# Patient Record
Sex: Male | Born: 1985 | Race: White | Hispanic: No | Marital: Single | Smoking: Current every day smoker
Health system: Southern US, Community
[De-identification: ages and names within clinical notes are randomized; demographics above are authoritative.]

## PROBLEM LIST (undated history)

## (undated) HISTORY — PX: DENTAL SURGERY: SHX609

## (undated) HISTORY — PX: KNEE SURGERY: SHX244

---

## 2017-12-28 ENCOUNTER — Encounter (HOSPITAL_COMMUNITY): Payer: Self-pay | Admitting: Emergency Medicine

## 2017-12-28 ENCOUNTER — Emergency Department (HOSPITAL_COMMUNITY)
Admission: EM | Admit: 2017-12-28 | Discharge: 2017-12-28 | Disposition: A | Discharge status: Home / Self Care | Payer: Self-pay | Attending: Emergency Medicine | Admitting: Emergency Medicine

## 2017-12-28 ENCOUNTER — Other Ambulatory Visit: Payer: Self-pay

## 2017-12-28 DIAGNOSIS — R21 Rash and other nonspecific skin eruption: Secondary | ICD-10-CM | POA: Insufficient documentation

## 2017-12-28 MED ORDER — PERMETHRIN 5 % EX CREA: TOPICAL_CREAM | CUTANEOUS | 1 refills | Status: AC

## 2017-12-28 NOTE — Discharge Instructions (Signed)
Today you were seen for a rash.  Per your request you have been given prescription for scabies treatment.  Please make sure that you adequately clean your living space including washing all clothing and bedding in hot water or placing in an airtight bag for 72 hours.  Please make sure that you get an exterminator to evaluate your living quarters for evidence of bedbugs, fleas, or other biting insects. ° °If your symptoms do not improve please seek additional medical care and evaluation. °

## 2017-12-28 NOTE — ED Triage Notes (Signed)
Patient c/o itching rash to bilateral arms and legs. Reports being around others with scabies. Hx of same.

## 2017-12-28 NOTE — ED Notes (Signed)
PT DISCHARGED. INSTRUCTIONS AND PRESCRIPTION GIVEN. AAOX4. PT IN NO APPARENT DISTRESS OR PAIN. THE OPPORTUNITY TO ASK QUESTIONS WAS PROVIDED. 

## 2017-12-28 NOTE — ED Provider Notes (Signed)
[facility]-EMERGENCY DEPT Provider Note   CSN: [id number] Arrival date & time: 12/28/17  1701     History   Chief Complaint Chief Complaint  Patient presents with  . Rash    HPI Bernard Sanders is a [age] y.o. male who presents today for evaluation of itchy rash to bilateral arms and legs.  He reports that he has had the symptoms for approximately 2 weeks and they have been gradually worsening.  He has a close household contact who has a similar rash.  He reports that he has had this rash in the past and was diagnosed with scabies.  He denies any fevers, muscle aches body aches nausea vomiting or other concerns or complaints.  HPI  History reviewed. No pertinent past medical history.  There are no active problems to display for this patient.   History reviewed. No pertinent surgical history.      Home Medications    Prior to Admission medications   Medication Sig Start Date End Date Taking? Authorizing Provider  permethrin (ELIMITE) 5 % cream Please apply a thin layer to your entire body, and wash off after 12 hours.  Please repeat treatment in 1 week 12/28/17   Cristina GongHammond, Elizabeth W, PA-C    Family History No family history on file.  Social History Social History   Tobacco Use  . Smoking status: Not on file  Substance Use Topics  . Alcohol use: Not on file  . Drug use: Not on file     Allergies   Patient has no known allergies.   Review of Systems Review of Systems  Constitutional: Negative for chills and fever.  Musculoskeletal: Negative for neck pain.  Skin: Positive for rash and wound. Negative for color change and pallor.  All other systems reviewed and are negative.    Physical Exam Updated Vital Signs BP (!) 148/98 (BP Location: Right Arm)   Pulse 94   Temp 98.1 F (36.7 C) (Oral)   Resp 18   SpO2 99%   Physical Exam  Constitutional: He is oriented to person, place, and time. He appears well-developed. No distress.    HENT:  Head: Normocephalic.  Eyes: Conjunctivae are normal. No scleral icterus.  Neck: Normal range of motion. Neck supple.  Neurological: He is alert and oriented to person, place, and time.  Skin: He is not diaphoretic.  Diffuse red palpable bumps, primarily on bilateral legs, arms, torso.  There is occasional areas of secondary excoriation.  No abnormal erythema, induration, or drainage.  There are few burrows in between fingers bilaterally.  Psychiatric: He has a normal mood and affect. His behavior is normal.  Nursing note and vitals reviewed.    ED Treatments / Results  Labs (all labs ordered are listed, but only abnormal results are displayed) Labs Reviewed - No data to display  EKG None  Radiology No results found.  Procedures Procedures (including critical care time)  Medications Ordered in ED Medications - No data to display   Initial Impression / Assessment and Plan / ED Course  I have reviewed the triage vital signs and the nursing notes.  Pertinent labs & imaging results that were available during my care of the patient were reviewed by me and considered in my medical decision making (see chart for details).    Discussed diagnosis & treatment of scabies.  They have been advised to followup with primary care doctor 2 weeks after treatment.  They have also been advised to clean entire  household including washing sheets and using R.I.D. spray in the car and on sofa.   The use of permethrin cream was discussed as well, they were told to use cream from head to toe & leave on  for 8-12 hours.  They've been advised to repeat treatment if new eruptions occur.   Return precautions were discussed with patient who states their understanding.  At the time of discharge patient denied any unaddressed complaints or concerns.  Patient is agreeable for discharge home.   Final Clinical Impressions(s) / ED Diagnoses   Final diagnoses:  Rash    ED Discharge Orders         Ordered    permethrin (ELIMITE) 5 % cream     12/28/17 1742       Cristina Gong, [place] 12/28/17 1746    Shaune Pollack, MD 12/30/17 [phone]

## 2018-03-05 ENCOUNTER — Encounter (HOSPITAL_COMMUNITY): Payer: Self-pay

## 2018-03-05 ENCOUNTER — Emergency Department (HOSPITAL_COMMUNITY)
Admission: EM | Admit: 2018-03-05 | Discharge: 2018-03-05 | Disposition: A | Discharge status: Home / Self Care | Payer: Self-pay | Attending: Emergency Medicine | Admitting: Emergency Medicine

## 2018-03-05 ENCOUNTER — Other Ambulatory Visit: Payer: Self-pay

## 2018-03-05 DIAGNOSIS — T401X1A Poisoning by heroin, accidental (unintentional), initial encounter: Secondary | ICD-10-CM | POA: Insufficient documentation

## 2018-03-05 DIAGNOSIS — F17210 Nicotine dependence, cigarettes, uncomplicated: Secondary | ICD-10-CM | POA: Insufficient documentation

## 2018-03-05 MED ORDER — SODIUM CHLORIDE 0.9 % IV BOLUS
1000.0000 mL | Freq: Once | INTRAVENOUS | Status: AC
  Administered 2018-03-05: 1000 mL via INTRAVENOUS

## 2018-03-05 MED ORDER — ONDANSETRON HCL 4 MG/2ML IJ SOLN
4.0000 mg | Freq: Once | INTRAMUSCULAR | Status: AC
  Administered 2018-03-05: 4 mg via INTRAVENOUS
  Filled 2018-03-05: qty 2
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
## 2018-03-05 NOTE — ED Provider Notes (Signed)
[facility] [place] Bernard Sanders Provider Note  CSN: [id number] Arrival date & time: 03/05/18 0037  Chief Complaint(s) Heroin Overdose  HPI Bernard Sanders is a 32 y.o. 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  Upon arrival patient was already alert and oriented x4 after receiving Narcan at home.  States that he relapsed today and shot up with heroin.  Patient was at home with his girlfriend who found him unconscious and apneic.  She had Narcan at hand and administered it.  She called EMS.  Upon EMS arrival patient was still lethargic but did not require additional Narcan.  He remained hemodynamically stable in route.  Patient now currently endorses mild nausea but denies any other physical complaints including chest pain, shortness of breath, abdominal pain, headache, neck pain.  HPI  Past Medical History History reviewed. No pertinent past medical history. There are no active problems to display for this patient.  Home Medication(s) Prior to Admission medications   Medication Sig Start Date End Date Taking? Authorizing Provider  permethrin (ELIMITE) 5 % cream Please apply a thin layer to your entire body, and wash off after 12 hours.  Please repeat treatment in 1 week 12/28/17   Norman Clay                                                                                                                                    Past Surgical History History reviewed. No pertinent surgical history. Family History No family history on file.  Social History Social History   Tobacco Use  . Smoking status: Current Every Day Smoker    Packs/day: 0.50    Types: Cigarettes  . Smokeless tobacco: Never Used  Substance Use Topics  . Alcohol use: Yes  . Drug use: Yes    Comment: Occasional use   Allergies Patient has no known allergies.  Review of  Systems Review of Systems All other systems are reviewed and are negative for acute change except as noted in the HPI  Physical Exam Vital Signs  I have reviewed the triage vital signs BP 119/79 (BP Location: Right Arm)   Pulse 83   Temp 98.2 F (36.8 C) (Oral)   Resp 16   Ht 6\' 1"  (1.854 m)   Wt 103 kg   SpO2 90%   BMI 29.95 kg/m   Physical Exam  Constitutional: He is oriented to person, place, and time. He appears well-developed and well-nourished. No distress.  HENT:  Head: Normocephalic and atraumatic.  Nose: Nose normal.  Eyes: Pupils are equal, round, and reactive to light. Conjunctivae and EOM are normal. Right eye exhibits no discharge. Left eye exhibits no discharge. No scleral icterus.  Neck: Normal range of motion. Neck supple.  Cardiovascular: Normal rate and regular rhythm. Exam reveals no gallop and no friction  rub.  No murmur heard. Pulmonary/Chest: Effort normal and breath sounds normal. No stridor. No respiratory distress. He has no rales.  Abdominal: Soft. He exhibits no distension. There is no tenderness.  Musculoskeletal: He exhibits no edema or tenderness.  Neurological: He is alert and oriented to person, place, and time.  Skin: Skin is warm and dry. No rash noted. He is not diaphoretic. No erythema.  Numerous tattoos throughout his body.  Psychiatric: He has a normal mood and affect.  Vitals reviewed.   ED Results and Treatments Labs (all labs ordered are listed, but only abnormal results are displayed) Labs Reviewed - No data to display                                                                                                                       EKG  EKG Interpretation  Date/Time:  Thursday March 05 2018 00:37:44 EDT Ventricular Rate:  80 PR Interval:    QRS Duration: 123 QT Interval:  407 QTC Calculation: 470 R Axis:   40 Text Interpretation:  Sinus rhythm consider atypical RBBB NO STEMI No old tracing to compare Confirmed by  Drema Pry [phone]) on 03/05/2018 2:12:52 AM      Radiology No results found. Pertinent labs & imaging results that were available during my care of the patient were reviewed by me and considered in my medical decision making (see chart for details).  Medications Ordered in ED Medications  ondansetron (ZOFRAN) injection 4 mg (4 mg Intravenous Given 03/05/18 0112)  sodium chloride 0.9 % bolus 1,000 mL (0 mLs Intravenous Stopped 03/05/18 0218)                                                                                                                                    Procedures Procedures  (including critical care time)  Medical Decision Making / ED Course I have reviewed the nursing notes for this encounter and the patient's prior records (if available in EHR or on provided paperwork).    Patient presented with heroin overdose requiring IM Narcan.  Did not require additional administration by EMS.  Currently he is alert and oriented x4, hemodynamically stable.  Given Zofran for nausea and IV fluids.  Monitored on cardiac monitor and pulse ox.  Monitored for 3 hours total.  No change in status.  The patient appears reasonably screened and/or stabilized for discharge and I doubt any other medical  condition or other [facility] requiring further screening, evaluation, or treatment in the ED at this time prior to discharge.  The patient is safe for discharge with strict return precautions.   Final Clinical Impression(s) / ED Diagnoses Final diagnoses:  Accidental overdose of heroin, initial encounter [facility])   Disposition: Discharge  Condition: Good  I have discussed the results, Dx and Tx plan with the patient who expressed understanding and agree(s) with the plan. Discharge instructions discussed at great length. The patient was given strict return precautions who verbalized understanding of the instructions. No further questions at time of discharge.    ED Discharge Orders     None         This chart was dictated using voice recognition software.  Despite best efforts to proofread,  errors can occur which can change the documentation meaning.   Nira Conn, MD 03/05/18 [phone]

## 2018-03-05 NOTE — ED Notes (Signed)
Blood work drawn and sent to lab, awaiting orders

## 2018-03-05 NOTE — ED Notes (Signed)
Patient falling asleep-O2 sat 87%-O2 restarted 4l/[place]

## 2018-03-05 NOTE — ED Notes (Signed)
Bed: RESB Expected date:  Expected time:  Means of arrival:  Comments: EMS heroin overdose/Narcan 4 mg

## 2018-09-10 ENCOUNTER — Encounter (HOSPITAL_COMMUNITY): Payer: Self-pay

## 2018-09-10 ENCOUNTER — Emergency Department (HOSPITAL_COMMUNITY): Payer: Self-pay

## 2018-09-10 ENCOUNTER — Other Ambulatory Visit: Payer: Self-pay

## 2018-09-10 ENCOUNTER — Emergency Department (HOSPITAL_COMMUNITY)
Admission: EM | Admit: 2018-09-10 | Discharge: 2018-09-10 | Disposition: A | Discharge status: Home / Self Care | Payer: Self-pay | Attending: Emergency Medicine | Admitting: Emergency Medicine

## 2018-09-10 DIAGNOSIS — Z23 Encounter for immunization: Secondary | ICD-10-CM | POA: Insufficient documentation

## 2018-09-10 DIAGNOSIS — Y929 Unspecified place or not applicable: Secondary | ICD-10-CM | POA: Insufficient documentation

## 2018-09-10 DIAGNOSIS — S61401A Unspecified open wound of right hand, initial encounter: Secondary | ICD-10-CM | POA: Insufficient documentation

## 2018-09-10 DIAGNOSIS — F17210 Nicotine dependence, cigarettes, uncomplicated: Secondary | ICD-10-CM | POA: Insufficient documentation

## 2018-09-10 DIAGNOSIS — Y999 Unspecified external cause status: Secondary | ICD-10-CM | POA: Insufficient documentation

## 2018-09-10 DIAGNOSIS — W25XXXA Contact with sharp glass, initial encounter: Secondary | ICD-10-CM | POA: Insufficient documentation

## 2018-09-10 DIAGNOSIS — Y9389 Activity, other specified: Secondary | ICD-10-CM | POA: Insufficient documentation

## 2018-09-10 DIAGNOSIS — S62329A Displaced fracture of shaft of unspecified metacarpal bone, initial encounter for closed fracture: Secondary | ICD-10-CM | POA: Insufficient documentation

## 2018-09-10 MED ORDER — BACITRACIN ZINC 500 UNIT/GM EX OINT
TOPICAL_OINTMENT | Freq: Two times a day (BID) | CUTANEOUS | Status: DC
  Administered 2018-09-10: 1 via TOPICAL
  Filled 2018-09-10: qty 3.6

## 2018-09-10 MED ORDER — TETANUS-DIPHTH-ACELL PERTUSSIS 5-2.5-18.5 LF-MCG/0.5 IM SUSP
0.5000 mL | Freq: Once | INTRAMUSCULAR | Status: AC
  Administered 2018-09-10: 0.5 mL via INTRAMUSCULAR
  Filled 2018-09-10: qty 0.5

## 2018-09-10 MED ORDER — BACITRACIN ZINC 500 UNIT/GM EX OINT: 1.0000 "application " | TOPICAL_OINTMENT | Freq: Two times a day (BID) | CUTANEOUS | 0 refills | Status: AC

## 2018-09-10 NOTE — ED Notes (Signed)
Area cleansed applied bacitracin, and wrapped with roll gauzed.
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
## 2018-09-10 NOTE — Discharge Instructions (Signed)
Apply the bacitracin ointment 2 times per day.  If you notice signs of infection such as increasing pain, drainage (especially purulent drainage), redness or warmth to the wound, fever, or any other new/concerning symptoms then return to this or any other ER for evaluation.  You may take ibuprofen and/or Tylenol for pain.  There is a possible small fracture at the base of your fifth metacarpal.  This does not appear to be causing any specific structural damage but if you develop severe or increasing pain you should return to be evaluated.
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
## 2020-02-26 IMAGING — DX RIGHT HAND - COMPLETE 3+ VIEW
3 series · 3 of 3 positions shown · non-contrast
Comparison: None.

CLINICAL DATA: Pain after punching mirror

EXAM:
RIGHT HAND - COMPLETE 3+ VIEW

[hand ap]
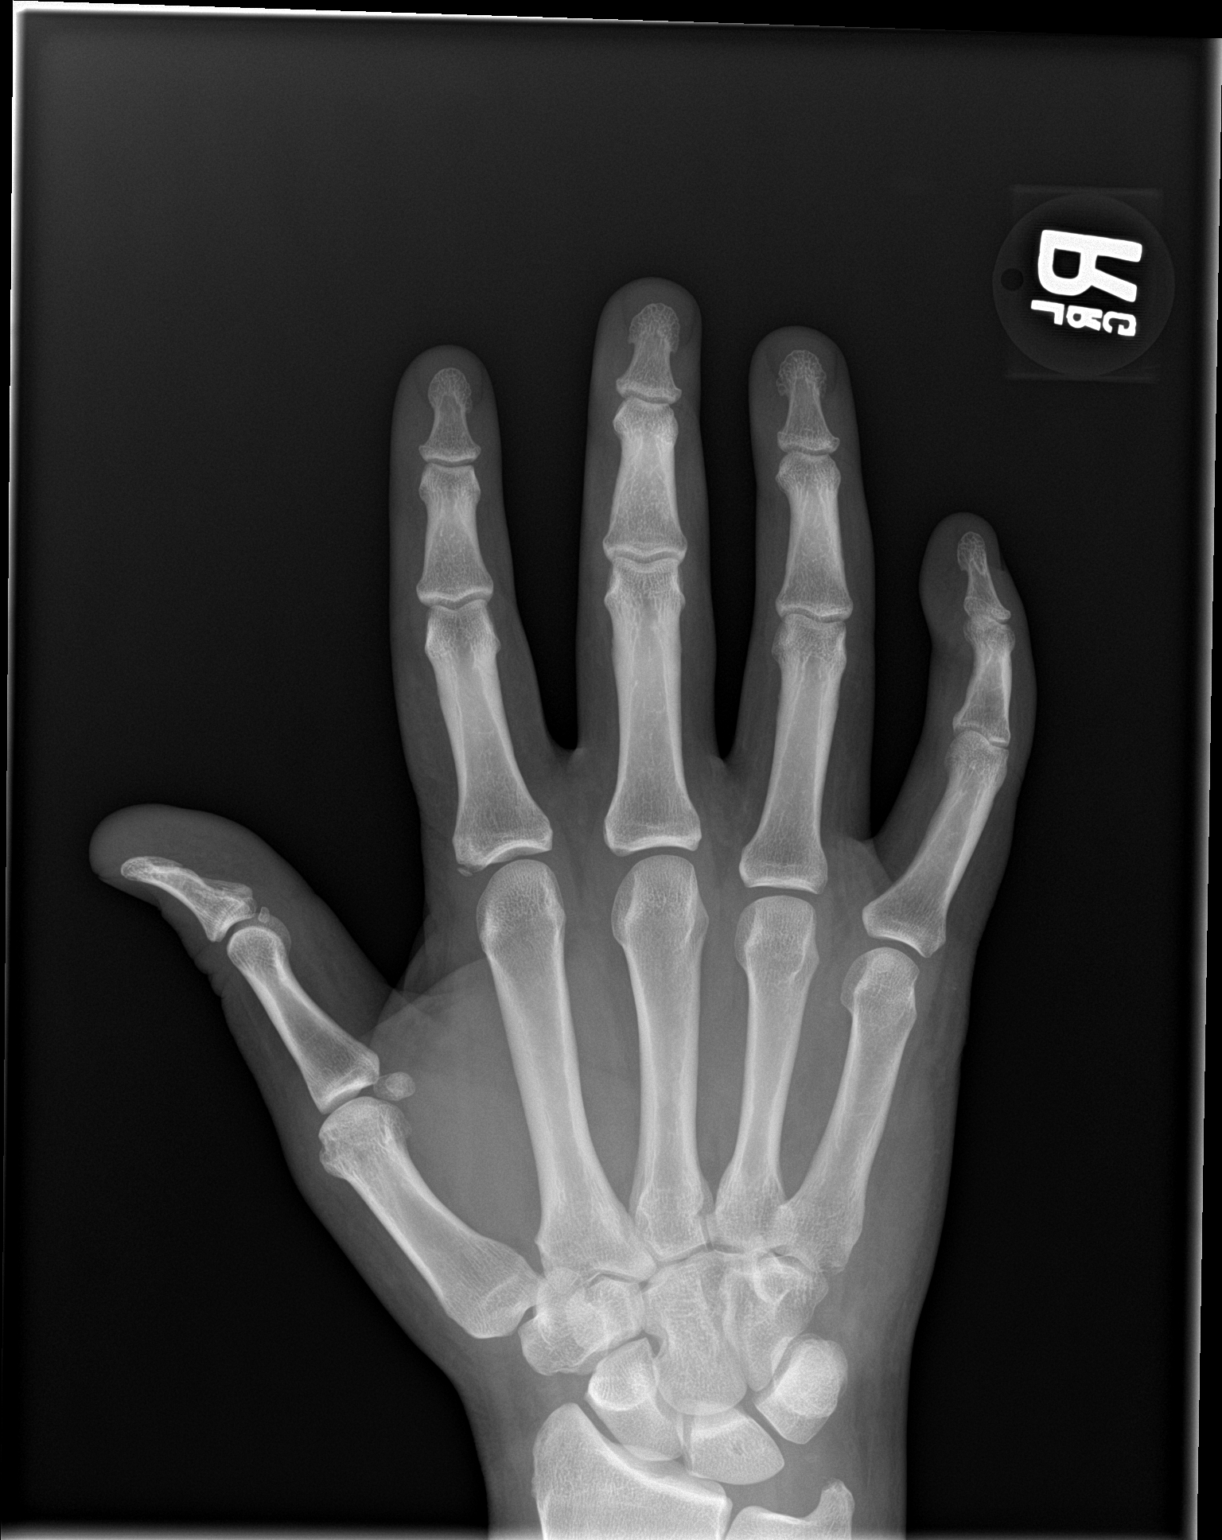

[hand obl]
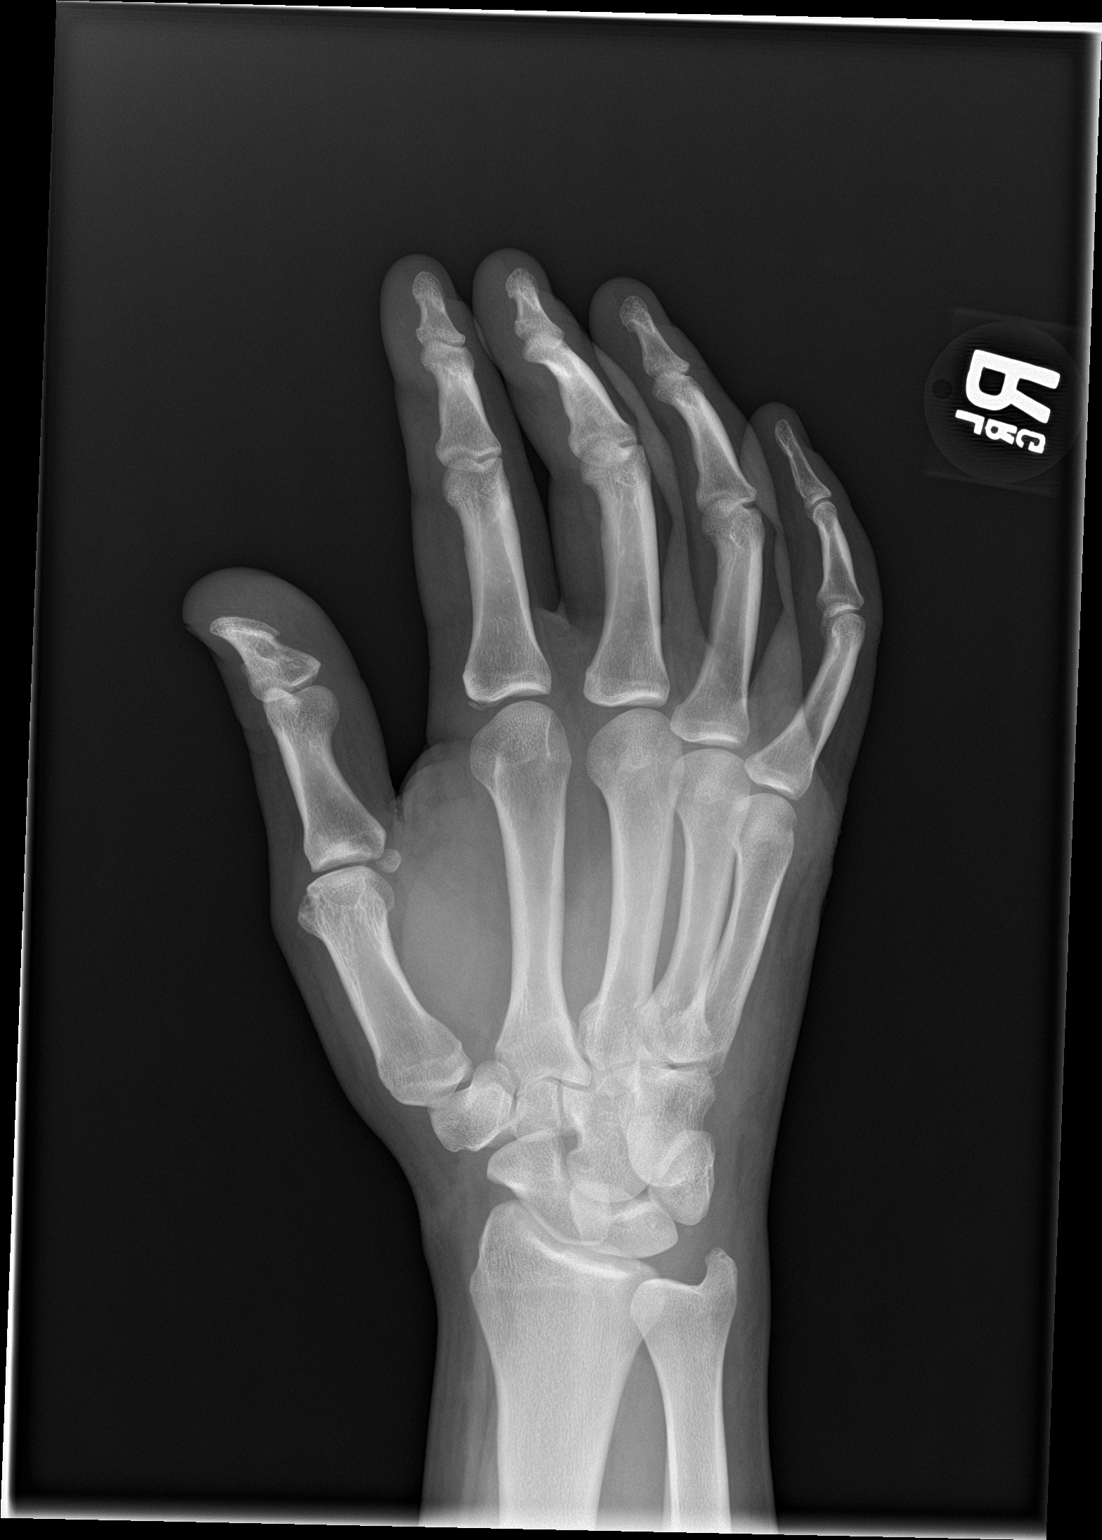

[hand lat]
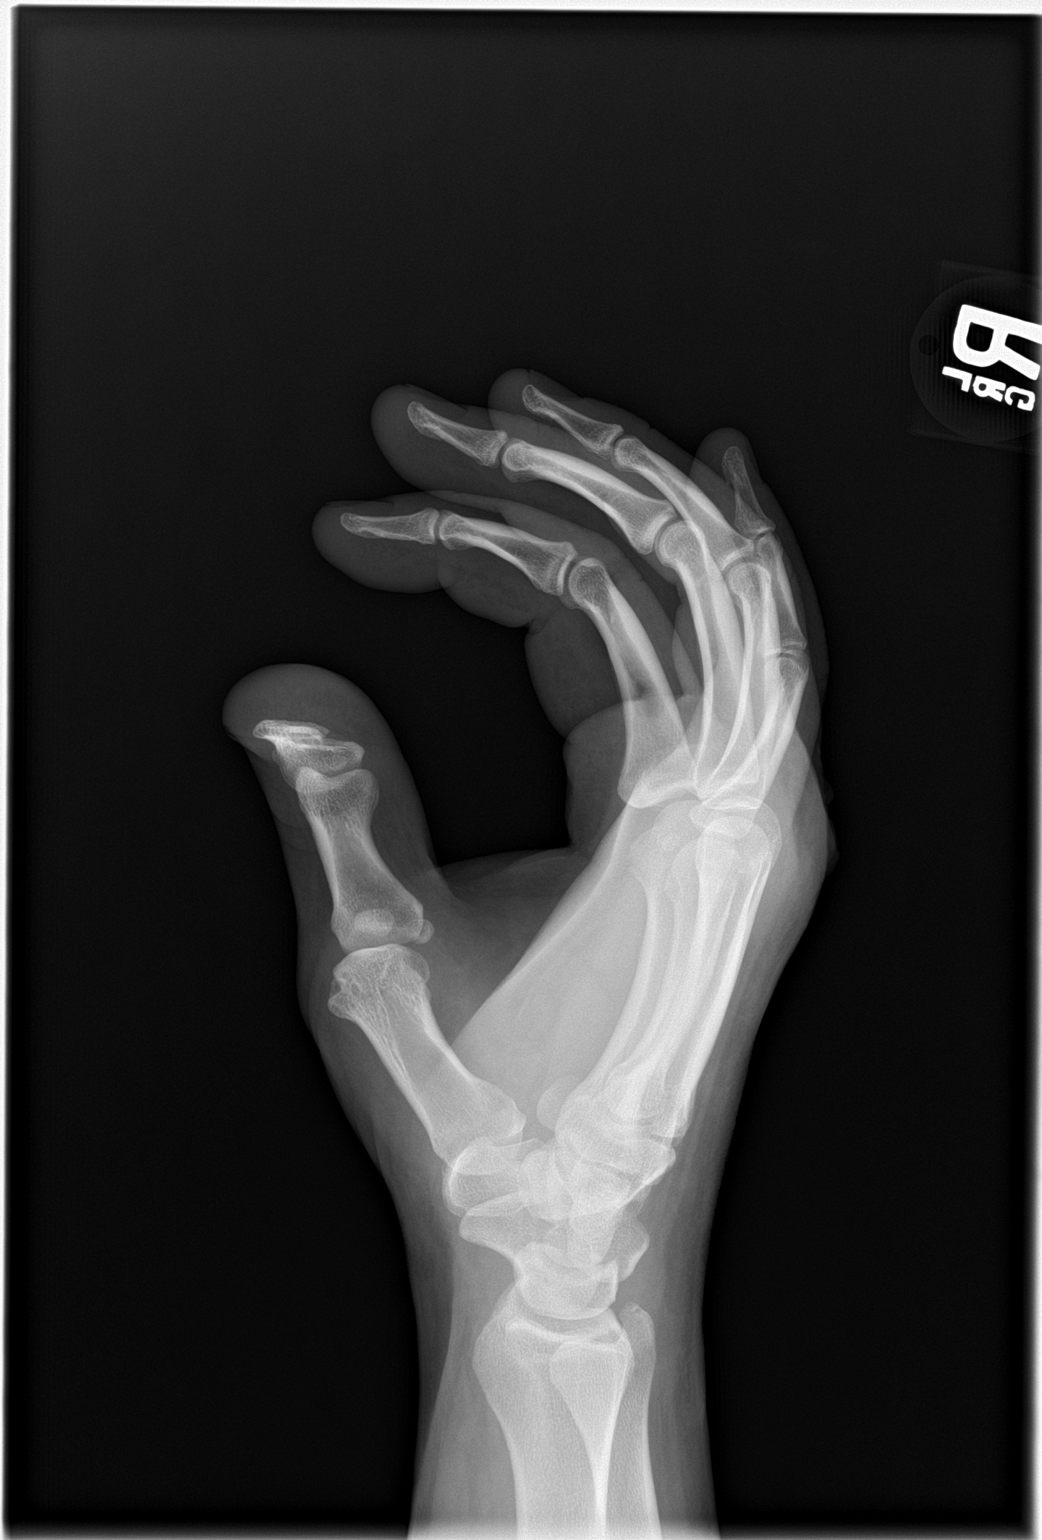

[3 of 3 positions shown; findings below may reference images not displayed]

FINDINGS: Frontal, oblique and lateral views were obtained. There is soft
tissue swelling, most notably dorsally. There is a subtle avulsion
along the medial proximal most aspect of the fifth proximal
metacarpal, a likely acute small fracture. No other acute fracture
evident. No dislocation. A small calcification along the lateral
proximal aspect of the second proximal phalanx appears chronic and
may represent an old avulsion injury. Joint spaces appear normal. No
erosive change. No radiopaque foreign body.
IMPRESSION: Soft tissue swelling. Small acute appearing avulsion along the
medial proximal most aspect of the fifth metacarpal. Suspect old
injury along the lateral aspect of the proximal most aspect of the
second proximal phalanx. No appreciable arthropathy. No radiopaque
foreign body.
